# Patient Record
Sex: Male | Born: 1965 | Race: White | Hispanic: Yes | Smoking: Never smoker
Health system: Southern US, Community
[De-identification: ages and names within clinical notes are randomized; demographics above are authoritative.]

## PROBLEM LIST (undated history)

## (undated) DIAGNOSIS — I10 Essential (primary) hypertension: Secondary | ICD-10-CM

## (undated) DIAGNOSIS — K219 Gastro-esophageal reflux disease without esophagitis: Secondary | ICD-10-CM

---

## 2004-07-11 ENCOUNTER — Emergency Department (HOSPITAL_COMMUNITY): Admission: EM | Admit: 2004-07-11 | Discharge: 2004-07-12 | Payer: Self-pay | Admitting: Emergency Medicine

## 2004-07-12 ENCOUNTER — Ambulatory Visit (HOSPITAL_COMMUNITY): Admission: RE | Admit: 2004-07-12 | Discharge: 2004-07-12 | Payer: Self-pay | Admitting: Ophthalmology

## 2005-08-30 ENCOUNTER — Emergency Department (HOSPITAL_COMMUNITY): Admission: EM | Admit: 2005-08-30 | Discharge: 2005-08-30 | Payer: Self-pay | Admitting: Emergency Medicine

## 2006-04-25 ENCOUNTER — Emergency Department (HOSPITAL_COMMUNITY): Admission: EM | Admit: 2006-04-25 | Discharge: 2006-04-25 | Payer: Self-pay | Admitting: Emergency Medicine

## 2009-07-03 ENCOUNTER — Ambulatory Visit (HOSPITAL_COMMUNITY): Admission: RE | Admit: 2009-07-03 | Discharge: 2009-07-03 | Payer: Self-pay | Admitting: Ophthalmology

## 2010-08-30 LAB — BASIC METABOLIC PANEL
Calcium: 9 mg/dL (ref 8.4–10.5)
Chloride: 102 mEq/L (ref 96–112)
Creatinine, Ser: 1 mg/dL (ref 0.4–1.5)
GFR calc Af Amer: >60 mL/min (ref >60)
Sodium: 139 mEq/L (ref 135–145)

## 2010-08-30 LAB — HEMOGLOBIN AND HEMATOCRIT, BLOOD
HCT: 42.9 % (ref 39.0–52.0)
Hemoglobin: 14.8 g/dL (ref 13.0–17.0)

## 2011-05-03 NOTE — Op Note (Signed)
NAME:  ,                                 ACCOUNT NO.:  MEDICAL RECORD NO.:  [id number]  LOCATION:                                 FACILITY:  PHYSICIAN:  Susanne Greenhouse, MD            DATE OF BIRTH:  DATE OF PROCEDURE: DATE OF DISCHARGE:                              OPERATIVE REPORT   PREOPERATIVE DIAGNOSIS:  Pterygium, right eye.  POSTOPERATIVE DIAGNOSIS:  Pterygium, right eye.  SURGEON:  Susanne Greenhouse, MD  ANESTHESIA:  Local with monitored anesthesia care.  INDICATION:  The patient has a pterygium which is causing constant irritation in the right eye.  DESCRIPTION OF OPERATION:  In the preoperative holding area, viscous lidocaine was placed into the right eye.  The patient was then brought to the operating room where he was prepped and draped.  Westcott scissors and Utrata forceps were used to create a small peritomy in the inferior nasal quadrant.  The subtenon space was then filled with a 1% non-preserved lidocaine solution.  A 6-0 silk suture was then placed into the cornea, partial thickness to use as a traction suture.  The eye was then retracted temporarily and Westcott scissors were used to sharply and bluntly dissect the pterygium from the underlying sclera. After marking the bone of the pterygium excision, the body of the pterygium was excised and grasped and then retracted from the globe removing the head of the pterygium from the cornea.  Bipolar cautery was then used for hemostasis in the sclera.  A diamond-dusted burr was then used to smooth the corneal bed where the excision was performed.  A Wexel sponge soaked in 0.002% mitomycin C was then applied to the scleral bed for 15 seconds.  Copious irrigation with balanced salt solution was then performed.  Next attention was turned to creating a free conjunctival autograft.  Superiorly area was marked for removing of the graft and using Westcott scissors, the conjunctiva was excised without the Tenon's capsule from  approximately 4 mm superior to the limbus.  After adequate releasing of the conjunctiva, the remainder of the graft was created by releasing it at the limbus.  The graft was then rotated into place and ensuring proper orientation, the graft was then secured into the bed of the old excision using several 9-0 Vicryl sutures.  After adequate closure, corneal suture was removed.  The patient tolerated the procedure well. There were no operative complications.  No prosthetic devices.  Surgical specimen was the conjunctival tissue submitted.  The patient tolerated the procedure well.  There were no operative complications and he was returned to the recovery in satisfactory condition.          ______________________________ Susanne Greenhouse, MD     KEH/MEDQ  D:  04/30/2011  T:  04/30/2011  Job:  [id number]

## 2012-10-27 DIAGNOSIS — R079 Chest pain, unspecified: Secondary | ICD-10-CM

## 2014-10-28 ENCOUNTER — Encounter (HOSPITAL_COMMUNITY): Payer: Self-pay | Admitting: Emergency Medicine

## 2014-10-28 ENCOUNTER — Emergency Department (HOSPITAL_COMMUNITY)
Admission: EM | Admit: 2014-10-28 | Discharge: 2014-10-28 | Disposition: A | Discharge status: Home / Self Care | Payer: 59 | Attending: Emergency Medicine | Admitting: Emergency Medicine

## 2014-10-28 ENCOUNTER — Emergency Department (HOSPITAL_COMMUNITY): Payer: 59

## 2014-10-28 DIAGNOSIS — I10 Essential (primary) hypertension: Secondary | ICD-10-CM | POA: Diagnosis not present

## 2014-10-28 DIAGNOSIS — R109 Unspecified abdominal pain: Secondary | ICD-10-CM | POA: Diagnosis present

## 2014-10-28 DIAGNOSIS — R1033 Periumbilical pain: Secondary | ICD-10-CM | POA: Insufficient documentation

## 2014-10-28 DIAGNOSIS — K219 Gastro-esophageal reflux disease without esophagitis: Secondary | ICD-10-CM | POA: Insufficient documentation

## 2014-10-28 DIAGNOSIS — R1032 Left lower quadrant pain: Secondary | ICD-10-CM | POA: Insufficient documentation

## 2014-10-28 DIAGNOSIS — Z79899 Other long term (current) drug therapy: Secondary | ICD-10-CM | POA: Diagnosis not present

## 2014-10-28 HISTORY — DX: Essential (primary) hypertension: I10

## 2014-10-28 HISTORY — DX: Gastro-esophageal reflux disease without esophagitis: K21.9

## 2014-10-28 LAB — COMPREHENSIVE METABOLIC PANEL
ALBUMIN: 4.6 g/dL (ref 3.5–5.0)
ALK PHOS: 110 U/L (ref 38–126)
ALT: 65 U/L — ABNORMAL HIGH (ref 17–63)
ANION GAP: 9 (ref 5–15)
AST: 35 U/L (ref 15–41)
BILIRUBIN TOTAL: 0.5 mg/dL (ref 0.3–1.2)
BUN: 16 mg/dL (ref 6–20)
CHLORIDE: 104 mmol/L (ref 101–111)
CO2: 28 mmol/L (ref 22–32)
Calcium: 9.5 mg/dL (ref 8.9–10.3)
Creatinine, Ser: 1 mg/dL (ref 0.61–1.24)
GFR calc Af Amer: >60 mL/min (ref >60)
GFR calc non Af Amer: >60 mL/min (ref >60)
Glucose, Bld: 104 mg/dL — ABNORMAL HIGH (ref 65–99)
POTASSIUM: 3.8 mmol/L (ref 3.5–5.1)
SODIUM: 141 mmol/L (ref 135–145)
TOTAL PROTEIN: 8 g/dL (ref 6.5–8.1)

## 2014-10-28 LAB — CBC WITH DIFFERENTIAL/PLATELET
BASOS PCT: 0 % (ref 0–1)
Basophils Absolute: 0 10*3/uL (ref 0.0–0.1)
EOS ABS: 0.1 10*3/uL (ref 0.0–0.7)
EOS PCT: 0 % (ref 0–5)
HCT: 43.2 % (ref 39.0–52.0)
Hemoglobin: 14.9 g/dL (ref 13.0–17.0)
Lymphocytes Relative: 11 % — ABNORMAL LOW (ref 12–46)
Lymphs Abs: 1.3 10*3/uL (ref 0.7–4.0)
MCH: 30.1 pg (ref 26.0–34.0)
MCHC: 34.5 g/dL (ref 30.0–36.0)
MCV: 87.3 fL (ref 78.0–100.0)
MONO ABS: 0.6 10*3/uL (ref 0.1–1.0)
MONOS PCT: 5 % (ref 3–12)
NEUTROS ABS: 9.8 10*3/uL — AB (ref 1.7–7.7)
Neutrophils Relative %: 84 % — ABNORMAL HIGH (ref 43–77)
Platelets: 220 10*3/uL (ref 150–400)
RBC: 4.95 MIL/uL (ref 4.22–5.81)
RDW: 13.1 % (ref 11.5–15.5)
WBC: 11.7 10*3/uL — ABNORMAL HIGH (ref 4.0–10.5)

## 2014-10-28 LAB — LIPASE, BLOOD: LIPASE: 23 U/L (ref 22–51)

## 2014-10-28 MED ORDER — PANTOPRAZOLE SODIUM 40 MG IV SOLR
40.0000 mg | Freq: Once | INTRAVENOUS | Status: AC
  Administered 2014-10-28: 40 mg via INTRAVENOUS
  Filled 2014-10-28: qty 40

## 2014-10-28 MED ORDER — ONDANSETRON HCL 4 MG/2ML IJ SOLN
4.0000 mg | Freq: Once | INTRAMUSCULAR | Status: AC
  Administered 2014-10-28: 4 mg via INTRAVENOUS
  Filled 2014-10-28: qty 2

## 2014-10-28 MED ORDER — FENTANYL CITRATE (PF) 100 MCG/2ML IJ SOLN
100.0000 ug | Freq: Once | INTRAMUSCULAR | Status: AC
  Administered 2014-10-28: 100 ug via INTRAVENOUS
  Filled 2014-10-28: qty 2

## 2014-10-28 MED ORDER — SUCRALFATE 1 G PO TABS: 1.0000 g | ORAL_TABLET | Freq: Three times a day (TID) | ORAL | Status: DC

## 2014-10-28 MED ORDER — SUCRALFATE 1 G PO TABS
1.0000 g | ORAL_TABLET | Freq: Three times a day (TID) | ORAL | Status: DC
  Administered 2014-10-28: 1 g via ORAL
  Filled 2014-10-28: qty 1

## 2014-10-28 MED ORDER — HYDROMORPHONE HCL 1 MG/ML IJ SOLN
1.0000 mg | Freq: Once | INTRAMUSCULAR | Status: AC
  Administered 2014-10-28: 1 mg via INTRAVENOUS
  Filled 2014-10-28: qty 1
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
## 2014-10-28 NOTE — Discharge Instructions (Signed)
Dolor abdominal °(Abdominal Pain) °El dolor puede tener muchas causas. Normalmente la causa del dolor abdominal no es una enfermedad y mejorará sin tratamiento. Frecuentemente puede controlarse y tratarse en casa. Su médico le realizará un examen físico y posiblemente solicite análisis de sangre y radiografías para ayudar a determinar la gravedad de su dolor. Sin embargo, en muchos casos, debe transcurrir más tiempo antes de que se pueda encontrar una causa evidente del dolor. Antes de llegar a ese punto, es posible que su médico no sepa si necesita más pruebas o un tratamiento más profundo. °INSTRUCCIONES PARA EL CUIDADO EN EL HOGAR  °Esté atento al dolor para ver si hay cambios. Las siguientes indicaciones ayudarán a aliviar cualquier molestia que pueda sentir: °· Tome solo medicamentos de venta libre o recetados, según las indicaciones del médico. °· No tome laxantes a menos que se lo haya indicado su médico. °· Pruebe con una dieta líquida absoluta (caldo, té o agua) según se lo indique su médico. Introduzca gradualmente una dieta normal, según su tolerancia. °SOLICITE ATENCIÓN MÉDICA SI: °· Tiene dolor abdominal sin explicación. °· Tiene dolor abdominal relacionado con náuseas o diarrea. °· Tiene dolor cuando orina o defeca. °· Experimenta dolor abdominal que lo despierta de noche. °· Tiene dolor abdominal que empeora o mejora cuando come alimentos. °· Tiene dolor abdominal que empeora cuando come alimentos grasosos. °· Tiene fiebre. °SOLICITE ATENCIÓN MÉDICA DE INMEDIATO SI:  °· El dolor no desaparece en un plazo máximo de 2 horas. °· No deja de (vomitar). °· El dolor se siente solo en partes del abdomen, como el lado derecho o la parte inferior izquierda del abdomen. °· Evacúa materia fecal sanguinolenta o negra, de aspecto alquitranado. °ASEGÚRESE DE QUE: °· Comprende estas instrucciones. °· Controlará su afección. °· Recibirá ayuda de inmediato si no mejora o si empeora. °Document Released: 05/31/2005  Document Revised: 06/05/2013 °ExitCare® Patient Information ©2015 [employer]. This information is not intended to replace advice given to you by your health care provider. Make sure you discuss any questions you have with your health care provider. ° °
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
## 2015-11-13 ENCOUNTER — Emergency Department (HOSPITAL_COMMUNITY)
Admission: EM | Admit: 2015-11-13 | Discharge: 2015-11-14 | Disposition: A | Discharge status: Home / Self Care | Payer: 59 | Attending: Emergency Medicine | Admitting: Emergency Medicine

## 2015-11-13 ENCOUNTER — Encounter (HOSPITAL_COMMUNITY): Payer: Self-pay | Admitting: Emergency Medicine

## 2015-11-13 ENCOUNTER — Emergency Department (HOSPITAL_COMMUNITY): Payer: 59

## 2015-11-13 DIAGNOSIS — Y92410 Unspecified street and highway as the place of occurrence of the external cause: Secondary | ICD-10-CM | POA: Diagnosis not present

## 2015-11-13 DIAGNOSIS — Y9389 Activity, other specified: Secondary | ICD-10-CM | POA: Insufficient documentation

## 2015-11-13 DIAGNOSIS — Z79899 Other long term (current) drug therapy: Secondary | ICD-10-CM | POA: Diagnosis not present

## 2015-11-13 DIAGNOSIS — M62830 Muscle spasm of back: Secondary | ICD-10-CM | POA: Diagnosis not present

## 2015-11-13 DIAGNOSIS — S2231XA Fracture of one rib, right side, initial encounter for closed fracture: Secondary | ICD-10-CM | POA: Diagnosis not present

## 2015-11-13 DIAGNOSIS — S161XXA Strain of muscle, fascia and tendon at neck level, initial encounter: Secondary | ICD-10-CM | POA: Insufficient documentation

## 2015-11-13 DIAGNOSIS — Y999 Unspecified external cause status: Secondary | ICD-10-CM | POA: Insufficient documentation

## 2015-11-13 DIAGNOSIS — I10 Essential (primary) hypertension: Secondary | ICD-10-CM | POA: Insufficient documentation

## 2015-11-13 DIAGNOSIS — M549 Dorsalgia, unspecified: Secondary | ICD-10-CM | POA: Diagnosis present

## 2015-11-13 MED ORDER — KETOROLAC TROMETHAMINE 60 MG/2ML IM SOLN
30.0000 mg | Freq: Once | INTRAMUSCULAR | Status: AC
  Administered 2015-11-14: 30 mg via INTRAMUSCULAR
  Filled 2015-11-13: qty 2
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
## 2015-11-14 MED ORDER — CYCLOBENZAPRINE HCL 10 MG PO TABS: 10.0000 mg | ORAL_TABLET | Freq: Two times a day (BID) | ORAL | Status: AC | PRN

## 2015-11-14 MED ORDER — DICLOFENAC SODIUM 50 MG PO TBEC: 50.0000 mg | DELAYED_RELEASE_TABLET | Freq: Two times a day (BID) | ORAL | Status: AC

## 2015-11-14 MED ORDER — HYDROCODONE-ACETAMINOPHEN 5-325 MG PO TABS: 1.0000 | ORAL_TABLET | ORAL | Status: AC | PRN

## 2015-11-14 MED ORDER — OXYCODONE-ACETAMINOPHEN 5-325 MG PO TABS: 2.0000 | ORAL_TABLET | ORAL | Status: AC | PRN

## 2015-11-14 NOTE — ED Notes (Signed)
Pre pax percocet given with instruction
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
## 2015-11-19 MED FILL — Oxycodone w/ Acetaminophen Tab 5-325 MG: ORAL | Qty: 6 | Status: AC

## 2016-01-12 ENCOUNTER — Other Ambulatory Visit (HOSPITAL_COMMUNITY): Payer: Self-pay | Admitting: Family Medicine

## 2016-01-12 ENCOUNTER — Ambulatory Visit (HOSPITAL_COMMUNITY)
Admission: RE | Admit: 2016-01-12 | Discharge: 2016-01-12 | Disposition: A | Discharge status: Home / Self Care | Payer: 59 | Source: Ambulatory Visit | Attending: Family Medicine | Admitting: Family Medicine

## 2016-01-12 DIAGNOSIS — R52 Pain, unspecified: Secondary | ICD-10-CM

## 2016-01-12 DIAGNOSIS — R937 Abnormal findings on diagnostic imaging of other parts of musculoskeletal system: Secondary | ICD-10-CM | POA: Insufficient documentation

## 2016-04-27 ENCOUNTER — Other Ambulatory Visit (HOSPITAL_COMMUNITY): Payer: Self-pay | Admitting: Family Medicine

## 2016-04-27 DIAGNOSIS — R1011 Right upper quadrant pain: Secondary | ICD-10-CM

## 2016-05-03 ENCOUNTER — Ambulatory Visit (HOSPITAL_COMMUNITY)
Admission: RE | Admit: 2016-05-03 | Discharge: 2016-05-03 | Disposition: A | Discharge status: Home / Self Care | Payer: 59 | Source: Ambulatory Visit | Attending: Family Medicine | Admitting: Family Medicine

## 2016-05-03 DIAGNOSIS — K760 Fatty (change of) liver, not elsewhere classified: Secondary | ICD-10-CM | POA: Diagnosis not present

## 2016-05-03 DIAGNOSIS — K8020 Calculus of gallbladder without cholecystitis without obstruction: Secondary | ICD-10-CM | POA: Diagnosis not present

## 2016-05-03 DIAGNOSIS — R1011 Right upper quadrant pain: Secondary | ICD-10-CM | POA: Diagnosis present

## 2016-05-03 DIAGNOSIS — R911 Solitary pulmonary nodule: Secondary | ICD-10-CM | POA: Insufficient documentation

## 2016-05-03 DIAGNOSIS — I700 Atherosclerosis of aorta: Secondary | ICD-10-CM | POA: Insufficient documentation

## 2016-05-03 DIAGNOSIS — N281 Cyst of kidney, acquired: Secondary | ICD-10-CM | POA: Diagnosis not present

## 2016-05-03 DIAGNOSIS — K5730 Diverticulosis of large intestine without perforation or abscess without bleeding: Secondary | ICD-10-CM | POA: Insufficient documentation

## 2016-05-03 MED ORDER — IOPAMIDOL (ISOVUE-300) INJECTION 61%
100.0000 mL | Freq: Once | INTRAVENOUS | Status: AC | PRN
  Administered 2016-05-03: 100 mL via INTRAVENOUS

## 2016-05-03 MED ORDER — IIOPAMIDOL (ISOVUE-250) INJECTION 51%: 100.0000 mL | Freq: Once | INTRAVENOUS | Status: DC | PRN
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
